# Patient Record
Sex: Female | Born: 1935 | Race: White | Hispanic: No
Health system: Southern US, Community
[De-identification: ages and names within clinical notes are randomized; demographics above are authoritative.]

---

## 2004-10-08 ENCOUNTER — Ambulatory Visit: Payer: Self-pay | Admitting: Unknown Physician Specialty

## 2006-01-21 ENCOUNTER — Ambulatory Visit: Payer: Self-pay | Admitting: Internal Medicine

## 2006-12-17 ENCOUNTER — Ambulatory Visit: Payer: Self-pay | Admitting: Internal Medicine

## 2007-01-24 ENCOUNTER — Ambulatory Visit: Payer: Self-pay | Admitting: Internal Medicine

## 2007-03-17 ENCOUNTER — Emergency Department: Payer: Self-pay | Admitting: Emergency Medicine

## 2008-02-06 ENCOUNTER — Ambulatory Visit: Payer: Self-pay | Admitting: Internal Medicine

## 2008-05-30 ENCOUNTER — Ambulatory Visit: Payer: Self-pay | Admitting: Rheumatology

## 2009-03-19 ENCOUNTER — Ambulatory Visit: Payer: Self-pay | Admitting: Internal Medicine

## 2009-05-22 ENCOUNTER — Ambulatory Visit: Payer: Self-pay | Admitting: Internal Medicine

## 2009-06-04 ENCOUNTER — Ambulatory Visit: Payer: Self-pay | Admitting: Internal Medicine

## 2009-06-07 ENCOUNTER — Ambulatory Visit: Payer: Self-pay | Admitting: Unknown Physician Specialty

## 2009-07-16 ENCOUNTER — Ambulatory Visit: Payer: Self-pay | Admitting: Unknown Physician Specialty

## 2009-10-08 ENCOUNTER — Ambulatory Visit: Payer: Self-pay | Admitting: Specialist

## 2010-03-10 ENCOUNTER — Ambulatory Visit: Payer: Self-pay | Admitting: Specialist

## 2010-05-27 ENCOUNTER — Ambulatory Visit: Payer: Self-pay | Admitting: Unknown Physician Specialty

## 2010-09-09 ENCOUNTER — Ambulatory Visit: Payer: Self-pay | Admitting: Specialist

## 2010-12-10 ENCOUNTER — Ambulatory Visit: Payer: Self-pay | Admitting: Specialist

## 2011-05-21 ENCOUNTER — Ambulatory Visit: Payer: Self-pay | Admitting: Internal Medicine

## 2011-09-09 ENCOUNTER — Ambulatory Visit: Payer: Self-pay | Admitting: Specialist

## 2012-10-03 ENCOUNTER — Emergency Department: Payer: Self-pay | Admitting: Emergency Medicine

## 2012-10-08 ENCOUNTER — Emergency Department: Payer: Self-pay | Admitting: Emergency Medicine

## 2012-10-08 LAB — COMPREHENSIVE METABOLIC PANEL
Albumin: 4 g/dL (ref 3.4–5.0)
Alkaline Phosphatase: 55 U/L (ref 50–136)
Anion Gap: 11 (ref 7–16)
BUN: 11 mg/dL (ref 7–18)
Bilirubin,Total: 0.9 mg/dL (ref 0.2–1.0)
Chloride: 102 mmol/L (ref 98–107)
Creatinine: 0.76 mg/dL (ref 0.60–1.30)
EGFR (African American): >60
EGFR (Non-African Amer.): >60
Glucose: 96 mg/dL (ref 65–99)
Osmolality: 275 (ref 275–301)
Sodium: 138 mmol/L (ref 136–145)
Total Protein: 7.3 g/dL (ref 6.4–8.2)

## 2012-10-08 LAB — URINALYSIS, COMPLETE
Glucose,UR: NEGATIVE mg/dL (ref 0–75)
Nitrite: NEGATIVE
Protein: NEGATIVE
Specific Gravity: 1.019 (ref 1.003–1.030)
Squamous Epithelial: 10
WBC UR: 41 /HPF (ref 0–5)

## 2012-10-08 LAB — CBC WITH DIFFERENTIAL/PLATELET
Basophil #: 0.2 10*3/uL — ABNORMAL HIGH (ref 0.0–0.1)
Basophil %: 1.7 %
Eosinophil #: 0 10*3/uL (ref 0.0–0.7)
HCT: 35.7 % (ref 35.0–47.0)
HGB: 11.7 g/dL — ABNORMAL LOW (ref 12.0–16.0)
Lymphocyte #: 2.1 10*3/uL (ref 1.0–3.6)
Lymphocyte %: 21.6 %
MCH: 35.2 pg — ABNORMAL HIGH (ref 26.0–34.0)
MCHC: 32.7 g/dL (ref 32.0–36.0)
MCV: 108 fL — ABNORMAL HIGH (ref 80–100)
Monocyte #: 1 x10 3/mm — ABNORMAL HIGH (ref 0.2–0.9)
Neutrophil %: 65.5 %
Platelet: 412 10*3/uL (ref 150–440)
RBC: 3.31 10*6/uL — ABNORMAL LOW (ref 3.80–5.20)
RDW: 19.4 % — ABNORMAL HIGH (ref 11.5–14.5)

## 2013-03-07 ENCOUNTER — Emergency Department: Payer: Self-pay | Admitting: Emergency Medicine

## 2013-03-07 LAB — URINALYSIS, COMPLETE
Blood: NEGATIVE
Glucose,UR: NEGATIVE mg/dL (ref 0–75)
Ketone: NEGATIVE
Protein: NEGATIVE
RBC,UR: 5 /HPF (ref 0–5)
Specific Gravity: 1.018 (ref 1.003–1.030)
Squamous Epithelial: NONE SEEN
WBC UR: 39 /HPF (ref 0–5)

## 2013-03-07 LAB — BASIC METABOLIC PANEL
BUN: 18 mg/dL (ref 7–18)
Calcium, Total: 8.7 mg/dL (ref 8.5–10.1)
Chloride: 104 mmol/L (ref 98–107)
Co2: 31 mmol/L (ref 21–32)
Creatinine: 1.26 mg/dL (ref 0.60–1.30)
EGFR (Non-African Amer.): 37 — ABNORMAL LOW
Osmolality: 279 (ref 275–301)
Potassium: 4.3 mmol/L (ref 3.5–5.1)
Sodium: 139 mmol/L (ref 136–145)

## 2013-03-07 LAB — CBC WITH DIFFERENTIAL/PLATELET
Basophil #: 0.1 10*3/uL (ref 0.0–0.1)
Basophil %: 1 %
Eosinophil %: 0.6 %
HCT: 29.8 % — ABNORMAL LOW (ref 35.0–47.0)
HGB: 10.1 g/dL — ABNORMAL LOW (ref 12.0–16.0)
Neutrophil #: 8.3 10*3/uL — ABNORMAL HIGH (ref 1.4–6.5)
Neutrophil %: 69.4 %
Platelet: 308 10*3/uL (ref 150–440)
RBC: 2.61 10*6/uL — ABNORMAL LOW (ref 3.80–5.20)
RDW: 18.8 % — ABNORMAL HIGH (ref 11.5–14.5)
WBC: 12.1 10*3/uL — ABNORMAL HIGH (ref 3.6–11.0)

## 2013-03-07 LAB — TROPONIN I: Troponin-I: <0.02 ng/mL

## 2013-03-08 ENCOUNTER — Inpatient Hospital Stay: Payer: Self-pay | Admitting: Family Medicine

## 2013-03-08 LAB — CBC
MCH: 37.6 pg — ABNORMAL HIGH (ref 26.0–34.0)
MCV: 116 fL — ABNORMAL HIGH (ref 80–100)
Platelet: 272 10*3/uL (ref 150–440)
RBC: 2.74 10*6/uL — ABNORMAL LOW (ref 3.80–5.20)

## 2013-03-08 LAB — BASIC METABOLIC PANEL
Anion Gap: 9 (ref 7–16)
Calcium, Total: 8.8 mg/dL (ref 8.5–10.1)
Chloride: 100 mmol/L (ref 98–107)
Co2: 26 mmol/L (ref 21–32)
Creatinine: 1.74 mg/dL — ABNORMAL HIGH (ref 0.60–1.30)
EGFR (African American): 29 — ABNORMAL LOW
Glucose: 100 mg/dL — ABNORMAL HIGH (ref 65–99)
Osmolality: 274 (ref 275–301)

## 2013-03-09 LAB — BASIC METABOLIC PANEL
Anion Gap: 3 — ABNORMAL LOW (ref 7–16)
BUN: 20 mg/dL — ABNORMAL HIGH (ref 7–18)
Calcium, Total: 7.9 mg/dL — ABNORMAL LOW (ref 8.5–10.1)
Chloride: 107 mmol/L (ref 98–107)
Creatinine: 1.2 mg/dL (ref 0.60–1.30)
EGFR (African American): 46 — ABNORMAL LOW
EGFR (Non-African Amer.): 39 — ABNORMAL LOW
Glucose: 90 mg/dL (ref 65–99)
Osmolality: 280 (ref 275–301)
Potassium: 3.9 mmol/L (ref 3.5–5.1)
Sodium: 139 mmol/L (ref 136–145)

## 2013-03-10 LAB — CBC WITH DIFFERENTIAL/PLATELET
Basophil #: 0.1 10*3/uL (ref 0.0–0.1)
Basophil %: 1.2 %
Eosinophil #: 0.2 10*3/uL (ref 0.0–0.7)
Eosinophil %: 2.2 %
HCT: 26.2 % — ABNORMAL LOW (ref 35.0–47.0)
Lymphocyte #: 1.5 10*3/uL (ref 1.0–3.6)
MCHC: 34.9 g/dL (ref 32.0–36.0)
MCV: 116 fL — ABNORMAL HIGH (ref 80–100)
Monocyte %: 13.7 %
Neutrophil %: 68.8 %
Platelet: 215 10*3/uL (ref 150–440)
RBC: 2.27 10*6/uL — ABNORMAL LOW (ref 3.80–5.20)

## 2013-03-10 LAB — BASIC METABOLIC PANEL
Calcium, Total: 7.8 mg/dL — ABNORMAL LOW (ref 8.5–10.1)
Co2: 27 mmol/L (ref 21–32)
Potassium: 4.2 mmol/L (ref 3.5–5.1)

## 2013-03-10 LAB — URINE CULTURE

## 2013-03-14 LAB — CULTURE, BLOOD (SINGLE)

## 2013-11-27 ENCOUNTER — Ambulatory Visit: Payer: Self-pay | Admitting: Internal Medicine

## 2013-12-06 ENCOUNTER — Emergency Department: Payer: Self-pay | Admitting: Emergency Medicine

## 2013-12-06 LAB — PRO B NATRIURETIC PEPTIDE: B-Type Natriuretic Peptide: 1808 pg/mL — ABNORMAL HIGH (ref 0–450)

## 2013-12-06 LAB — URINALYSIS, COMPLETE
Bilirubin,UR: NEGATIVE
Ketone: NEGATIVE
Protein: NEGATIVE
RBC,UR: 5 /HPF (ref 0–5)
Specific Gravity: 1.009 (ref 1.003–1.030)
WBC UR: 129 /HPF (ref 0–5)

## 2013-12-06 LAB — COMPREHENSIVE METABOLIC PANEL
Alkaline Phosphatase: 76 U/L
Anion Gap: 4 — ABNORMAL LOW (ref 7–16)
BUN: 8 mg/dL (ref 7–18)
Bilirubin,Total: 0.3 mg/dL (ref 0.2–1.0)
Creatinine: 0.84 mg/dL (ref 0.60–1.30)
EGFR (African American): >60
EGFR (Non-African Amer.): >60
SGPT (ALT): 9 U/L — ABNORMAL LOW (ref 12–78)
Sodium: 143 mmol/L (ref 136–145)
Total Protein: 5.8 g/dL — ABNORMAL LOW (ref 6.4–8.2)

## 2013-12-06 LAB — CBC WITH DIFFERENTIAL/PLATELET
Basophil #: 0.3 10*3/uL — ABNORMAL HIGH (ref 0.0–0.1)
Basophil %: 1.8 %
Eosinophil #: 0.2 10*3/uL (ref 0.0–0.7)
HGB: 11 g/dL — ABNORMAL LOW (ref 12.0–16.0)
MCH: 35 pg — ABNORMAL HIGH (ref 26.0–34.0)
MCHC: 31.9 g/dL — ABNORMAL LOW (ref 32.0–36.0)
MCV: 110 fL — ABNORMAL HIGH (ref 80–100)
Monocyte #: 1.8 x10 3/mm — ABNORMAL HIGH (ref 0.2–0.9)
Neutrophil %: 63 %
Platelet: 540 10*3/uL — ABNORMAL HIGH (ref 150–440)
RBC: 3.12 10*6/uL — ABNORMAL LOW (ref 3.80–5.20)
RDW: 18.7 % — ABNORMAL HIGH (ref 11.5–14.5)
WBC: 15.1 10*3/uL — ABNORMAL HIGH (ref 3.6–11.0)

## 2013-12-06 LAB — TROPONIN I: Troponin-I: <0.02 ng/mL

## 2013-12-26 ENCOUNTER — Inpatient Hospital Stay: Payer: Self-pay | Admitting: Family Medicine

## 2013-12-26 LAB — TROPONIN I: Troponin-I: <0.02 ng/mL

## 2013-12-26 LAB — URINALYSIS, COMPLETE
Bilirubin,UR: NEGATIVE
Blood: NEGATIVE
Glucose,UR: NEGATIVE mg/dL (ref 0–75)
Leukocyte Esterase: NEGATIVE
Ph: 5 (ref 4.5–8.0)
RBC,UR: <1 /HPF (ref 0–5)
Squamous Epithelial: 1
WBC UR: 1 /HPF (ref 0–5)

## 2013-12-26 LAB — CBC WITH DIFFERENTIAL/PLATELET
Bands: 13 %
Basophil #: 0.1 10*3/uL (ref 0.0–0.1)
Eosinophil #: 0 10*3/uL (ref 0.0–0.7)
Eosinophil %: 0.1 %
HCT: 32.3 % — ABNORMAL LOW (ref 35.0–47.0)
Lymphocyte #: 3.4 10*3/uL (ref 1.0–3.6)
Lymphocytes: 5 %
MCHC: 32.8 g/dL (ref 32.0–36.0)
Monocyte %: 3 %
Monocytes: 3 %
Neutrophil %: 88.6 %
Platelet: 433 10*3/uL (ref 150–440)
RBC: 3 10*6/uL — ABNORMAL LOW (ref 3.80–5.20)
RDW: 19.7 % — ABNORMAL HIGH (ref 11.5–14.5)

## 2013-12-26 LAB — BASIC METABOLIC PANEL
Anion Gap: 7 (ref 7–16)
BUN: 21 mg/dL — ABNORMAL HIGH (ref 7–18)
Chloride: 100 mmol/L (ref 98–107)
Co2: 26 mmol/L (ref 21–32)
EGFR (African American): 56 — ABNORMAL LOW
EGFR (Non-African Amer.): 48 — ABNORMAL LOW
Glucose: 112 mg/dL — ABNORMAL HIGH (ref 65–99)
Osmolality: 270 (ref 275–301)
Potassium: 3.2 mmol/L — ABNORMAL LOW (ref 3.5–5.1)
Sodium: 133 mmol/L — ABNORMAL LOW (ref 136–145)

## 2013-12-26 LAB — CK TOTAL AND CKMB (NOT AT ARMC): CK-MB: 0.8 ng/mL (ref 0.5–3.6)

## 2013-12-26 LAB — HEPATIC FUNCTION PANEL A (ARMC)
Albumin: 2.2 g/dL — ABNORMAL LOW (ref 3.4–5.0)
Alkaline Phosphatase: 109 U/L
Bilirubin, Direct: 0.3 mg/dL — ABNORMAL HIGH (ref 0.00–0.20)
Bilirubin,Total: 0.8 mg/dL (ref 0.2–1.0)
SGOT(AST): 12 U/L — ABNORMAL LOW (ref 15–37)
SGPT (ALT): 13 U/L (ref 12–78)

## 2013-12-26 LAB — PRO B NATRIURETIC PEPTIDE: B-Type Natriuretic Peptide: 3176 pg/mL — ABNORMAL HIGH (ref 0–450)

## 2013-12-26 LAB — LIPASE, BLOOD: Lipase: 29 U/L — ABNORMAL LOW (ref 73–393)

## 2013-12-27 LAB — CBC WITH DIFFERENTIAL/PLATELET
Basophil #: 0.1 10*3/uL (ref 0.0–0.1)
Eosinophil #: 0 10*3/uL (ref 0.0–0.7)
HCT: 26.2 % — ABNORMAL LOW (ref 35.0–47.0)
HGB: 8.6 g/dL — ABNORMAL LOW (ref 12.0–16.0)
Lymphocyte #: 1.5 10*3/uL (ref 1.0–3.6)
MCH: 35.2 pg — ABNORMAL HIGH (ref 26.0–34.0)
MCV: 107 fL — ABNORMAL HIGH (ref 80–100)
Neutrophil %: 91.9 %
Platelet: 350 10*3/uL (ref 150–440)
RBC: 2.44 10*6/uL — ABNORMAL LOW (ref 3.80–5.20)
RDW: 19.8 % — ABNORMAL HIGH (ref 11.5–14.5)
WBC: 31.5 10*3/uL — ABNORMAL HIGH (ref 3.6–11.0)

## 2013-12-27 LAB — COMPREHENSIVE METABOLIC PANEL
Albumin: 1.6 g/dL — ABNORMAL LOW (ref 3.4–5.0)
Alkaline Phosphatase: 76 U/L
Anion Gap: 6 — ABNORMAL LOW (ref 7–16)
Bilirubin,Total: 0.4 mg/dL (ref 0.2–1.0)
Calcium, Total: 7.8 mg/dL — ABNORMAL LOW (ref 8.5–10.1)
Chloride: 106 mmol/L (ref 98–107)
EGFR (Non-African Amer.): 51 — ABNORMAL LOW
Glucose: 83 mg/dL (ref 65–99)
Potassium: 2.9 mmol/L — ABNORMAL LOW (ref 3.5–5.1)
SGOT(AST): 9 U/L — ABNORMAL LOW (ref 15–37)
Total Protein: 4.5 g/dL — ABNORMAL LOW (ref 6.4–8.2)

## 2013-12-27 LAB — TSH: Thyroid Stimulating Horm: 1.27 u[IU]/mL

## 2013-12-31 LAB — CULTURE, BLOOD (SINGLE)

## 2014-01-28 ENCOUNTER — Ambulatory Visit: Payer: Self-pay | Admitting: Internal Medicine

## 2014-01-28 DEATH — deceased

## 2014-03-17 IMAGING — CT CT CHEST W/O CM
2 of 3 series · 15 of 36 positions shown, 18 images · non-contrast
Comparison: Radiographs 12/26/2013 and 12/06/2013. Chest CT
03/08/2013.

CLINICAL DATA: Decreased appetite with weakness for 4 days. Pleural
effusion.

EXAM:
CT CHEST WITHOUT CONTRAST
TECHNIQUE: Multidetector CT imaging of the chest was performed following the
standard protocol without IV contrast.

[Series 2: routine chest wo · axial · 0.60mm/px · z∈[+45,+270]mm · 12 of 53 slices shown, 15 images]
[im 4/53  mediastinal]
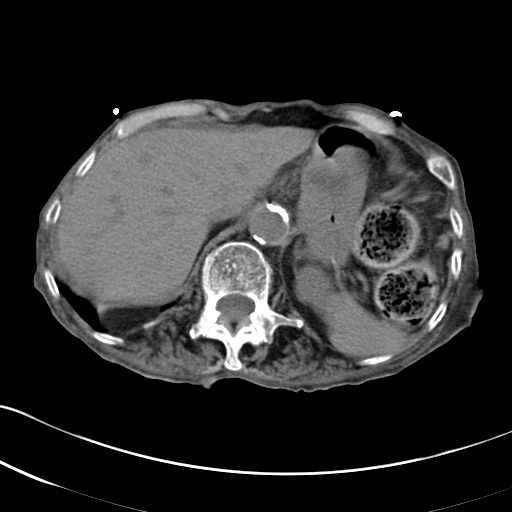
[im 4/53  lung]
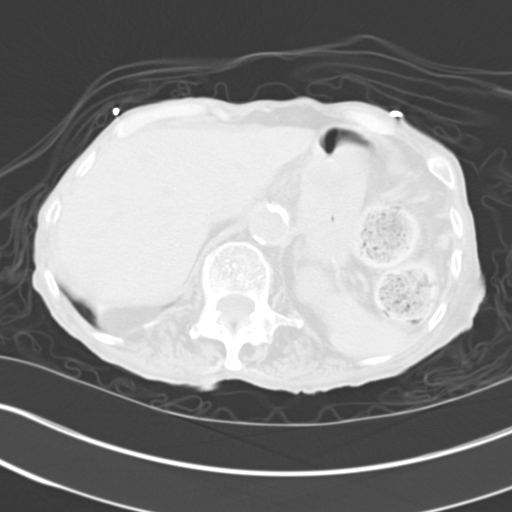
[im 8/53  lung]
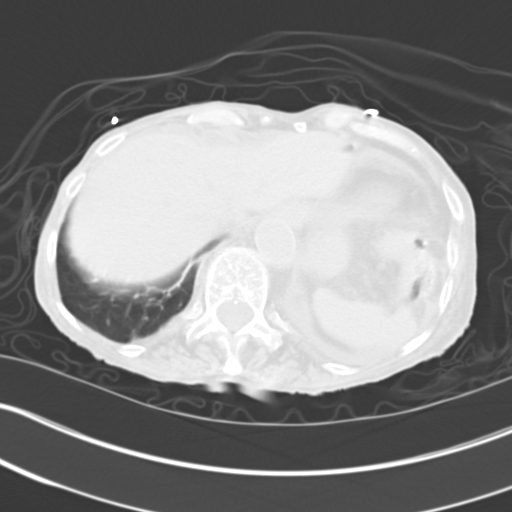
[im 12/53  lung]
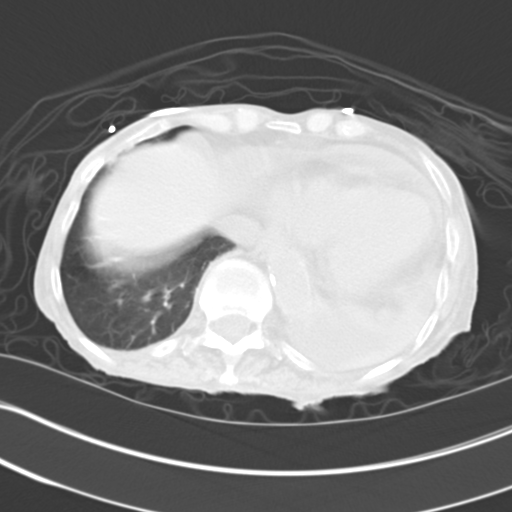
[im 16/53  lung]
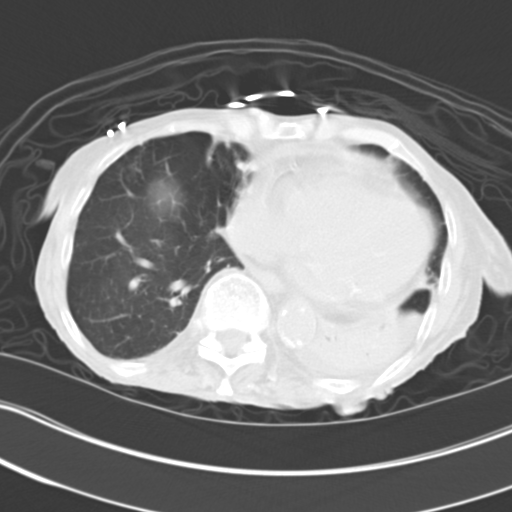
[im 20/53  mediastinal]
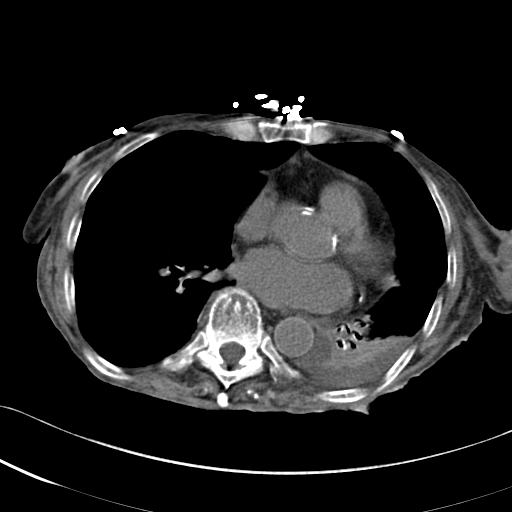
[im 20/53  lung]
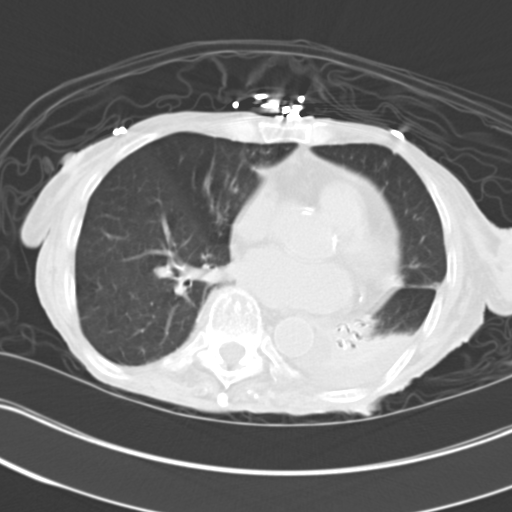
[im 24/53  lung]
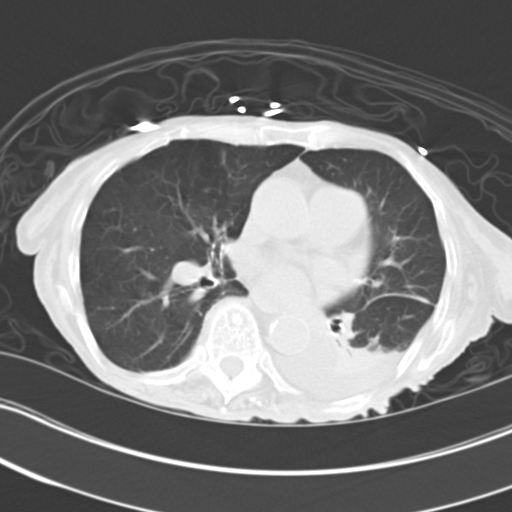
[im 29/53  lung]
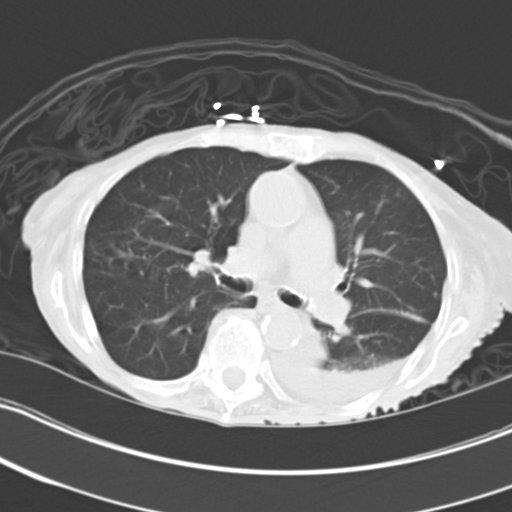
[im 33/53  lung]
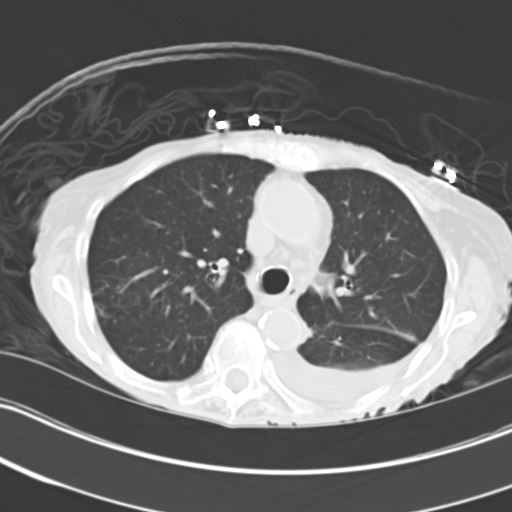
[im 37/53  mediastinal]
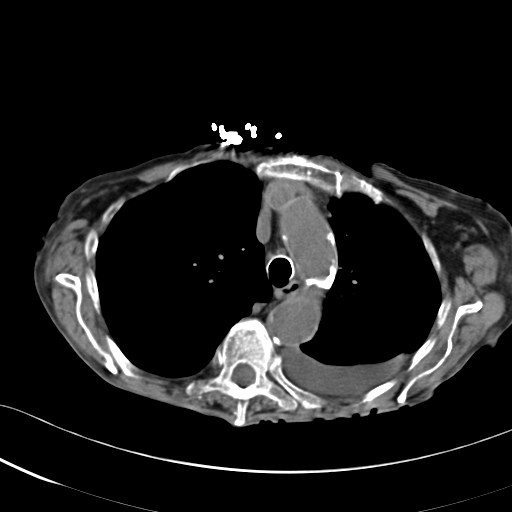
[im 37/53  lung]
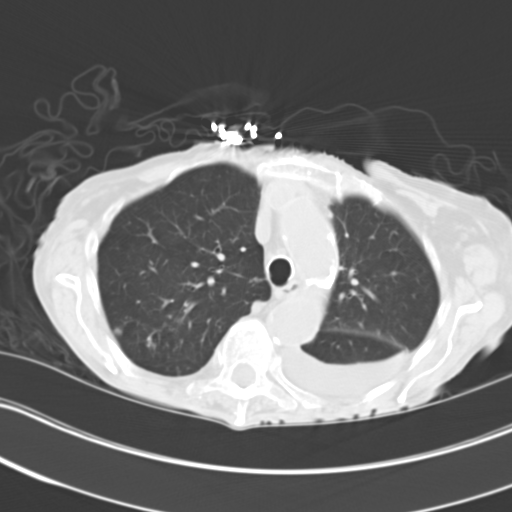
[im 41/53  lung]
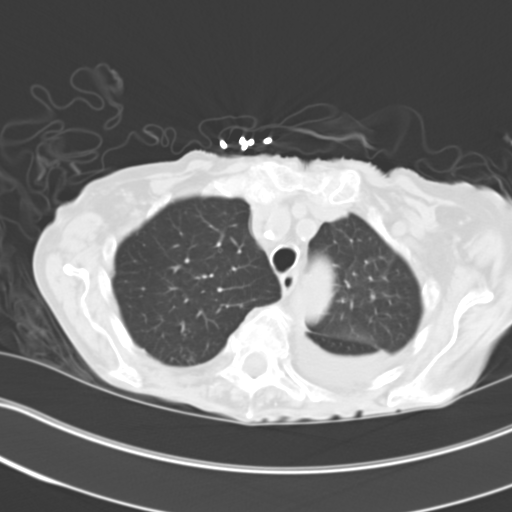
[im 45/53  lung]
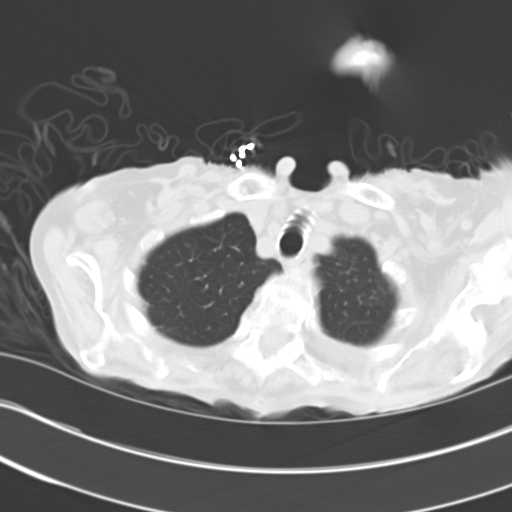
[im 49/53  lung]
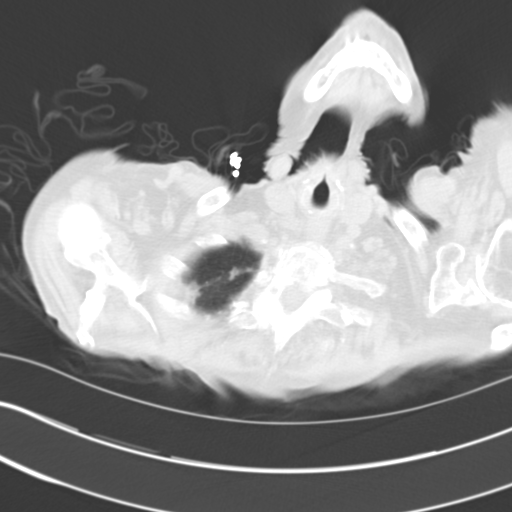

[Series 5: cor routine chest wo · coronal · 0.54mm/px · 3 of 97 slices shown]
[im 20/97  lung]
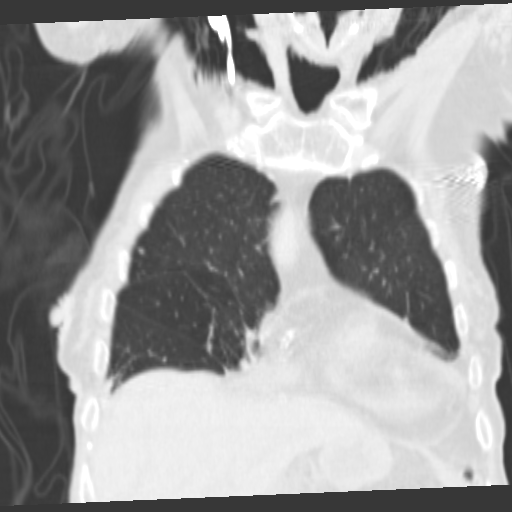
[im 39/97  lung]
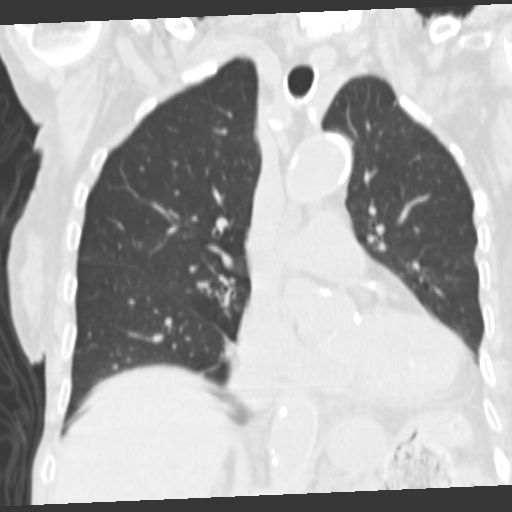
[im 58/97  lung]
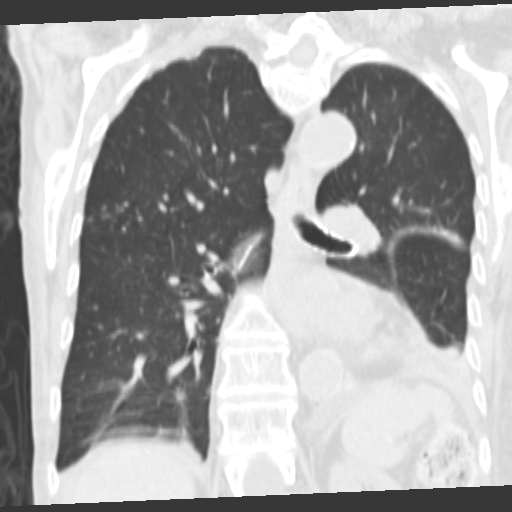

[15 of 36 positions shown; findings below may reference images not displayed]

FINDINGS: No enlarged mediastinal, hilar or axillary lymph nodes are
identified. There is diffuse atherosclerosis of the aorta, great
vessels and coronary arteries. The density of the intravascular
blood pool is decreased, consistent with anemia.

There are enlarging small pericardial and left pleural effusions.
The left pleural effusion is dependent without apparent complexity.
No significant pleural fluid is present on the right.

There is increased volume loss, airspace disease and air
bronchograms within the left lower lobe. No endobronchial lesion or
dominant mass is apparent. Scattered nodularity throughout the right
lung is similar to the prior study. This has a tree-in-bud
distribution, most consistent with a chronic inflammatory process,
likely mycobacterium avium complex.

The visualized upper abdomen appears unremarkable. The bones are
demineralized without apparent acute findings. There is a convex
right thoracic scoliosis. Diffuse chest wall muscular atrophy is
noted.
IMPRESSION: 1. Chronic but progressive left lower lobe volume loss with
opacification and air bronchograms. There is an adjacent enlarging
left pleural effusion. These findings are nonspecific, although
potentially secondary to chronic aspiration. If not previously
performed, thoracentesis to evaluate the pleural effusion should be
considered.
2. No dominant mass or adenopathy is identified to suggest
underlying neoplasm.
3. Chronic right lung tree-in-bud nodularity, likely chronic XINYEE
infection.
4. Small pericardial effusion.
5. Atherosclerosis and anemia are noted.

## 2015-04-19 NOTE — Discharge Summary (Signed)
PATIENT NAME:  Lindsey FermoCOPELAND, [name] MR#:  [id number] DATE OF BIRTH:  04-12-1919  DATE OF ADMISSION:  03/08/2013 DATE OF DISCHARGE:  03/10/2013   DISCHARGE DIAGNOSES: 1.  Hypoxia, resolved.  2.  Dehydration/hypotension, resolved.  3.  Urinary tract infection.  4.  Hypothyroidism.  5.  Chronic anemia.  6.  Monoclonal gammopathy. 7.  History of hypertension and medications are being held.  8.  Insomnia.  9.  Osteoporosis.   DISCHARGE MEDICATIONS: 1.  Enablex 15 mg p.o. extended release 1 tab daily.  2.  Tramadol 50 mg p.o. b.i.d. as needed for pain.  3.  Acetaminophen 325 mg p.o. q.6 hours as needed for pain and fever.  4.  Levofloxacin 250 mg daily x 1 more day.  5.  Levothyroxine 50 mcg p.o. daily on an empty stomach in the morning.   CONSULTS: None.   PROCEDURES: None.   PERTINENT LABORATORIES:  ON DAY OF DISCHARGE:  Sodium 138, potassium 4.2, creatinine 0.88, glucose 91.  White blood cell count 10.8, hemoglobin 9.1, and platelets 215. TSH 0.06. Troponins were negative x 2. Urinalysis showed 2+ leukocyte esterase, culture pending.   BRIEF HOSPITAL COURSE:   PROBLEM #1: Hypoxia. The patient initially came in with signs of hypoxia, which spontaneously resolved. CT of the of the chest was negative for any acute abnormality.   PROBLEM #2:  Hypertension and dehydration. The patient initially came in with low blood pressure.  Blood pressure medications were held. She was placed on IV fluids and has responded appropriately. Blood pressure is now back to her normal range. Will continue to hold the blood pressure medications at this time. May restart the diuretic if she has some fluid overload.   PROBLEM #3:  Urinary tract infection: Urinalysis did show 2+ leukocyte esterase, She was treated with levofloxacin. Her white blood cell count trended down from 23.4 to 10.8. She has remained afebrile. She will complete a 3-day course of this antibiotic.   Other chronic medical issues remained  stable except for hypothyroidism which TSH was found to be low at 0.06,.  We decreased the Synthroid to 50 mcg daily.   DISPOSITION: She is in stable condition to be discharged to rehab facility where she needs further nursing and physical therapy care.  [facility].  Once a bed is available she may be discharged to follow with Dr. Burnadette PopLinthavong in 1 week after discharge.     ____________________________ Marisue IvanKanhka Danny Yackley, MD kl:ct D: 03/10/2013 07:53:00 ET T: 03/10/2013 08:13:13 ET JOB#: [id number]  cc: Marisue IvanKanhka Seanna Sisler, MD, <Dictator> Marisue IvanKANHKA Clarkson Rosselli MD ELECTRONICALLY SIGNED 04/07/2013 10:00

## 2015-04-19 NOTE — H&P (Signed)
PATIENT NAME:  Lindsey Holder, Sabriyah H MR#:  [id number] DATE OF BIRTH:  1919-07-08  DATE OF ADMISSION:  03/08/2013  PRIMARY CARE PHYSICIAN:  Marisue IvanKanhka Linthavong, MD  REFERRING PHYSICIAN:  Maurilio LovelyNoelle McLaurin, MD  CHIEF COMPLAINT:  The patient is not providing any history. The daughter is giving little information that she was found on the floor and brought here. She has hypoxia, acute renal failure, urinary tract infection as well as hypotension likely secondary to the volume depletion and acute renal failure in the face of taking a diuretic and nifedipine.   HISTORY OF PRESENT ILLNESS:  The patient is 81 year old Caucasian female with history of dementia, systemic hypertension and hypothyroidism. She lives at home alone and visited by her daughter. The daughter states that she found her yesterday on the floor in her home and then decided today to bring her to the hospital for evaluation. The daughter has some kind of neurological problems, is unable to express her thoughts and gives very little information.  The patient herself has dementia and has no clue about anything. The patient was admitted for further evaluation and treatment.   REVIEW OF SYSTEMS:  A 10-point system review is unobtainable due to patient's dementia.   PAST MEDICAL HISTORY:  Dementia, hypertension, hypothyroidism on replacement therapy, gastroesophageal reflux disease, past history of anemia and gammopathy, history of osteoporosis.   PAST SURGICAL HISTORY:  Bilateral cataract surgery and rectal surgery.   SOCIAL HABITS:  Nonsmoker. No history of alcohol or drug abuse.   SOCIAL HISTORY:  She is widowed. Lives at home alone and visited by her daughter every 2 to 3 days.   FAMILY HISTORY:  I cannot obtain any history from the patient. Her daughter also is difficult to get any historical information from. She has no idea about the patient's parents, but she states that she has 2 sisters and brothers. She does not recall any medical  problems that they suffer from.   ADMISSION MEDICATIONS:  She is taking Enablex 15 mg once a day, furosemide 20 mg a day, levothyroxine 75 mcg once a day, nifedipine 60 mg once a day, tramadol 50 mg twice a day.   ALLERGIES:  AMBIEN, ASPIRIN CAUSING GI DISTRESS, DARVOCET, DETROL CAUSING URINARY RETENTION, TAPAZOLE, VICODIN CAUSING HIVES.   PHYSICAL EXAMINATION: VITAL SIGNS: Her blood pressure is 81/46, respiratory rate 18, pulse 80, temperature 96. Her O2 saturation initially was ranging between 76 to 83%. On oxygen, it is 100%.  GENERAL APPEARANCE: Elderly thin-looking female lying in bed in no acute distress.  HEAD AND NECK: No pallor. No icterus. No cyanosis. Ear examination revealed slightly decreased hearing. No lesions, no ulcers, no discharge. Examination of the nose revealed no discharge, no ulcers. Oropharyngeal area showed dry lips and mucous membranes. No oral thrush. No ulcers. Eye examination revealed normal eyelids and conjunctivae. Pupils about 4 mm, equal sluggishly if any, reactive to light. Neck is supple. Trachea at midline. No cervical masses.  HEART: Exam revealed distant heart sounds. Faint S1, S2. No S3, S4. No murmur. No gallop. No carotid bruits.  RESPIRATORY: Revealed normal breathing pattern without use of accessory muscles. No rales. No wheezing.  ABDOMEN: Soft without tenderness. No hepatosplenomegaly. No masses. No hernias.  SKIN: No ulcers, no subcutaneous nodules. There are a few small varicose veins in lower extremities especially on the left leg just above the ankle.  MUSCULOSKELETAL: No joint swelling. No clubbing.  NEUROLOGIC: Cranial nerves II through XII are intact. No focal motor deficit.  PSYCHIATRIC: The patient  has advanced dementia. She does not know where she is. She thinks that this is [place]. She does not recognize this is a hospital. She does not know why she is here. She could not recognize her daughter who is at the bedside. Other than that, she  looks calm and cooperative.   LABORATORY FINDINGS AND RADIOLOGIC DATA:  Her chest x-ray showed consolidation at the left lower lung field with silhouette of the diaphragm compared to her previous chest x-ray, possibly either versus consolidation or pneumonia. CAT scan of the head showed no acute intracranial abnormality. There are chronic small vessel ischemic changes. There is also cerebral atrophy. Her CBC showed white count 23,000 and hemoglobin 10. Her hemoglobin last week was also 10. Hematocrit is 31, platelet count 272, MCV is 116. D-dimer is 2.2. Serum glucose is 100, BUN 23 with a creatinine of 1.7. Yesterday, her BUN was 18 with a creatinine of 1.2. Sodium is 135, potassium 4.3. Troponin is less than 0.02. Urinalysis showed cloudy urine, 13 white blood cells, +3 bacteria, nitrite positive.   ASSESSMENT:   1.  Hypoxia with evidence of left pleural effusion versus consolidation or pneumonia.  2.  Acute renal failure likely secondary to prerenal azotemia and dehydration.  3.  Hypotension either from advancing renal failure while on lasix and Nifedipine or from           sepsis. 4- Urinary tract infection.  5.  Hyperthyroidism. Her TSH is 0.06. Her dose of levothyroid needs to be reduced.  6.  Dementia.  7.  Systemic hypertension by history- now hypotensive.   PLAN:  We will admit the patient to the medical floor with IV hydration to correct the volume depletion and the renal failure. Repeat basic metabolic profile tomorrow. Blood cultures x 2 were taken and urine cultures. Empiric IV antibiotic using Levaquin. This will cover for the urinary tract infection and also for pneumonia. I will obtain CAT scan of the chest without contrast to evaluate the process at the left lower lobe to see whether this is effusion versus consolidation. Again, I will reduce the thyroid medicine dose from 75 mcg to 50 mcg. I will hold the Lasix and nifedipine given the hypotension. Regarding her living will and CODE  STATUS, the patient cannot give me any information. She has dementia. The daughter was of little help, but she understood my question and she feels that she is FULL CODE.   TIME SPENT IN EVALUATING THIS PATIENT:  Took more than 1 hour including reviewing her medical records and discussion with the daughter. Critical care time spent was more than 30 minutes.    ____________________________ Carney Corners. Rudene Re, MD amd:si D: 03/08/2013 23:26:07 ET T: 03/08/2013 23:41:44 ET JOB#: [id number]  cc: Carney Corners. Rudene Re, MD, <Dictator> Karolee Ohs Dala Dock MD ELECTRONICALLY SIGNED 03/09/2013 2:17

## 2015-04-19 NOTE — Consult Note (Signed)
   Comments   I met with pt's daughter, grandson, and sister. Updated family. They say she has been declining over past month and is essentially now requiring total care from family in the home. They plan to take her back home at discharge and are in agreement with hospice involvement. They all seem to recognize pt's risk for continued decline and that she may be approaching end of life. They do not want a very aggressive workup for etiology of her decline. We talked about code status. All family feel patient should be a DNR. Portable form signed and placed in the chart.   Electronic Signatures: Nazaria Riesen, Kirt Boys (NP)  (Signed 31-Dec-14 11:03)  Authored: Palliative Care   Last Updated: 31-Dec-14 11:03 by Irean Hong (NP)

## 2015-04-20 NOTE — Discharge Summary (Signed)
PATIENT NAME:  Lindsey Holder, Lindsey Holder MR#:  [id number] DATE OF BIRTH:  November 20, 1919  DATE OF ADMISSION:  12/26/2013 DATE OF DISCHARGE:  12/27/2013  ADDENDUM   DISCHARGE MEDICATIONS:  Enablex 15 mg p.o. at bedtime, furosemide 20 mg p.o. every other day, levothyroxine 50 mcg p.o. daily, docusate as needed for constipation, nifedipine 60 mg p.o. at bedtime, acetaminophen 650 mg 2 tabs at bedtime. Levofloxacin 250 mg p.o. daily x 10 days and total, Ultram 50 mg q. 6 hours as needed for pain.    ____________________________ Marisue IvanKanhka Heron Pitcock, MD kl:dp D: 12/27/2013 12:45:48 ET T: 12/27/2013 13:18:01 ET JOB#: [id number]  cc: Marisue IvanKanhka Zaydn Gutridge, MD, <Dictator> Marisue IvanKANHKA Melodi Happel MD ELECTRONICALLY SIGNED 01/02/2014 9:40

## 2015-04-20 NOTE — Discharge Summary (Signed)
PATIENT NAME:  Lindsey Holder, Lindsey Holder MR#:  [id number] DATE OF BIRTH:  02-13-1919  DATE OF ADMISSION:  12/26/2013 DATE OF DISCHARGE:  12/27/2013   DISCHARGE DIAGNOSES: 1.  A large left pleural infusion.  2.  Acute leukocytosis.  3.  Failure to thrive.  4.  Hypothyroidism.   DISCHARGE MEDICATIONS:  Same as the medications she came in on, and see hospice discharge orders for medications.   BRIEF HOSPITAL COURSE:  1.  Failure to thrive: The patient initially came in because of her progressive weakness and failure to thrive, unable to perform ADLs. She was noted to have large left pleural effusion on CT of the chest, and also had leukocytosis. I explained to the family that I was concerned for possible malignancy. The family was agreeable to the palliative care. After being seen by palliative care, the family has decided to take the patient home with hospice. Plan for discharge today to home with hospice care. We will await further orders from hospice as an outpatient.     ____________________________ Marisue IvanKanhka Harman Langhans, MD kl:dmm D: 12/27/2013 11:43:13 ET T: 12/27/2013 12:05:15 ET JOB#: [id number]  cc: Marisue IvanKanhka Milledge Gerding, MD, <Dictator> Marisue IvanKANHKA Garland Smouse MD ELECTRONICALLY SIGNED 01/02/2014 9:40

## 2015-04-20 NOTE — H&P (Signed)
PATIENT NAME:  Lindsey Holder, URIEGAS MR#:  [id number] DATE OF BIRTH:  Jan 08, 1919  DATE OF ADMISSION:  12/26/2013  PRIMARY CARE PHYSICIAN: Dr. Marisue Ivan at [facility].   CHIEF COMPLAINT: Worsening weakness.   HISTORY OF PRESENT ILLNESS: This is a 81 year old female with a history of gastroesophageal reflux, chronic anemia, monoclonal gammopathy, hypertension, insomnia, hypothyroidism, osteoporosis, with progressive weakness and unable to perform ADLs. She was brought to the Emergency Room by her family on the suggestion of her physical therapist who states that she is unable to perform simple tasks. She has had decreased appetite, poor oral intake and continues to lose weight. Denies any nausea, vomiting, fever, chills, night sweats, abdominal pain, chest pain or shortness of breath.   PAST MEDICAL HISTORY: As stated above in HPI.   SURGICAL HISTORY:  1. Rectal surgery, previous rectal polyps.  2. Bilateral cataract extraction.  3. Left hip replacement.   HOME MEDICATIONS:  1. Enablex 24-hour 15 mg p.o. daily.  2. Nifedipine 60 mg p.o. daily.  3. Furosemide 20 mg p.o. daily.  4. Stool softener as needed.  5. Tylenol Arthritis 650 two tabs p.o. at bedtime.  6. Synthroid 50 mcg p.o. daily.   ALLERGIES: ASPIRIN, KEFLEX, CLINDAMYCIN.   SOCIAL HISTORY: She lives with her grandson and granddaughter. Has 24-hour supervision and is followed by physical therapy and nursing. Denies any tobacco, alcohol or drug use.   REVIEW OF SYSTEMS: As stated above in HPI.    PHYSICAL EXAMINATION:  VITAL SIGNS: Temperature is 97.7, blood pressure 109/57, pulse 90, O2 saturation 95% on room air, at 18 per minute.  GENERAL: Non-ill-appearing, elderly, thin, white female. Alert and oriented x 3, in no apparent distress.  HEENT: Extraocular movements intact. Pupils equal and reactive to light and accommodation.  NECK: No obvious lymphadenopathy of the neck.  CARDIOVASCULAR: Regular rate and rhythm.   RESPIRATORY: Clear to auscultation bilaterally. No wheezing. No cough. No rhonchi.  ABDOMEN: Thin, soft, nontender, nondistended. No hepatosplenomegaly. No mass palpated. No rebound or guarding.  NEUROLOGIC: She is alert and oriented x 3. Able to move all limbs but has baseline weakness.  SKIN: Normal color. Normal turgor.  PSYCHIATRIC: Normal affect.   PERTINENT LABORATORIES AND STUDIES: Sodium 133, potassium 3.2, BUN 21, creatinine 1.01, glucose 112. BNP of 3176. Albumin 2.2, total protein 6.1, calcium 8.3. LFTs within normal limits. White blood cell count 42.5, hemoglobin 10.6, platelets 433. Cardiac enzymes are negative. Urinalysis negative. CT of the head showed no acute changes. CT of the chest showed an enlarging left pleural effusion with a right chronic inflammatory process.   ASSESSMENT AND PLAN:  1. Failure to thrive: The patient initially comes in with worsening weakness and failure to thrive. She has been losing weight and has had a decreased appetite. She has been having 24-hour supervision. My concern for her is that she is just wasting. I recommend hospice and palliative care to evaluate the patient to determine whether she meets criteria for treatment and care. The patient was noted to have an albumin of 2.2 and a total protein of 6.1. Will not limit her diet in any way.  2. Pleural effusion: The patient is noted to have an enlarging left pleural effusion on CT of the chest. She has no history of congestive heart failure. No other infiltrate or mass seen at this time. I spoke with the family whether or not to proceed with further investigation such as a thoracentesis for biopsy and cytology. At this time, we will  hold off on any type of procedure. I am concerned that she may have underlying heart failure versus a metastatic malignancy given her white blood cell count of 42.5. Will hold off on any further investigation at this time.  3. Leukocytosis: The patient was noted to have an  acutely elevated white blood cell count of 42.5 without fever or any other constitutional symptoms. Urine was negative. No acute findings on chest x-ray. Will cover with Levaquin at this time. Would recommend a peripheral smear and may consider hematology workup but will discuss this further with the family and the patient in case the patient turns into palliative hospice care.  4. Other chronic medical issues: Stable at this time. Will continue with her home regimen except for the nifedipine. Will hold that given her lower blood pressure.   DISPOSITION: She is in fair condition. Meets criteria for admission for inpatient status. I did spend over 45 minutes with the family discussing her case.   ____________________________ Marisue IvanKanhka Jayln Branscom, MD kl:gb D: 12/26/2013 21:42:00 ET T: 12/26/2013 22:19:33 ET JOB#: [id number]  cc: Marisue IvanKanhka Zedekiah Hinderman, MD, <Dictator> Marisue IvanKANHKA Marieelena Bartko MD ELECTRONICALLY SIGNED 01/02/2014 9:39
